# Patient Record
Sex: Male | Born: 2000 | Race: White | Hispanic: No | Marital: Single | Smoking: Never smoker
Health system: Southern US, Community
[De-identification: ages and names within clinical notes are randomized; demographics above are authoritative.]

---
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
## 2023-05-04 ENCOUNTER — Emergency Department (HOSPITAL_COMMUNITY)
Admission: EM | Admit: 2023-05-04 | Discharge: 2023-05-05 | Disposition: A | Discharge status: Home / Self Care | Payer: Self-pay | Attending: Emergency Medicine | Admitting: Emergency Medicine

## 2023-05-04 ENCOUNTER — Other Ambulatory Visit: Payer: Self-pay

## 2023-05-04 ENCOUNTER — Encounter (HOSPITAL_COMMUNITY): Payer: Self-pay | Admitting: Emergency Medicine

## 2023-05-04 DIAGNOSIS — R1084 Generalized abdominal pain: Secondary | ICD-10-CM | POA: Insufficient documentation

## 2023-05-04 DIAGNOSIS — R109 Unspecified abdominal pain: Secondary | ICD-10-CM

## 2023-05-04 LAB — URINALYSIS, ROUTINE W REFLEX MICROSCOPIC
Bilirubin Urine: NEGATIVE
Glucose, UA: NEGATIVE mg/dL
Hgb urine dipstick: NEGATIVE
Ketones, ur: NEGATIVE mg/dL
Leukocytes,Ua: NEGATIVE
Nitrite: NEGATIVE
Protein, ur: NEGATIVE mg/dL
Specific Gravity, Urine: 1.026 (ref 1.005–1.030)
pH: 5 (ref 5.0–8.0)

## 2023-05-04 LAB — COMPREHENSIVE METABOLIC PANEL
ALT: 20 U/L (ref 0–44)
AST: 19 U/L (ref 15–41)
Albumin: 4.5 g/dL (ref 3.5–5.0)
Alkaline Phosphatase: 92 U/L (ref 38–126)
Anion gap: 10 (ref 5–15)
BUN: 10 mg/dL (ref 6–20)
CO2: 25 mmol/L (ref 22–32)
Calcium: 9.5 mg/dL (ref 8.9–10.3)
Chloride: 102 mmol/L (ref 98–111)
Creatinine, Ser: 1 mg/dL (ref 0.61–1.24)
GFR, Estimated: >60 mL/min (ref >60)
Glucose, Bld: 124 mg/dL — ABNORMAL HIGH (ref 70–99)
Potassium: 3.8 mmol/L (ref 3.5–5.1)
Sodium: 137 mmol/L (ref 135–145)
Total Bilirubin: 0.8 mg/dL (ref 0.3–1.2)
Total Protein: 7.7 g/dL (ref 6.5–8.1)

## 2023-05-04 LAB — CBC
HCT: 45.7 % (ref 39.0–52.0)
Hemoglobin: 16.2 g/dL (ref 13.0–17.0)
MCH: 31.6 pg (ref 26.0–34.0)
MCHC: 35.4 g/dL (ref 30.0–36.0)
MCV: 89.1 fL (ref 80.0–100.0)
Platelets: 223 10*3/uL (ref 150–400)
RBC: 5.13 MIL/uL (ref 4.22–5.81)
RDW: 11.9 % (ref 11.5–15.5)
WBC: 7.2 10*3/uL (ref 4.0–10.5)
nRBC: 0 % (ref 0.0–0.2)

## 2023-05-04 LAB — LIPASE, BLOOD: Lipase: 28 U/L (ref 11–51)

## 2023-05-04 NOTE — ED Triage Notes (Signed)
Pt in with abdominal "gas-like" pain since this morning. States he saw what looked like a worm in his stool. Went to [facility] and they discharged him, told him to try OTC meds for relief. Pt states the pain is worse tonight and stool is now mucousy and "stringy". Lives with unvaccinated cats, thinks he may have a parasite

## 2023-05-05 LAB — GASTROINTESTINAL PANEL BY PCR, STOOL (REPLACES STOOL CULTURE)

## 2023-05-05 NOTE — Discharge Instructions (Signed)
Take tylenol or ibuprofen for pain. Follow up on your stool culture results. We advise follow up with gastroenterology. Call to schedule an appointment. Return for new or concerning symptoms.

## 2023-05-24 NOTE — ED Provider Notes (Signed)
[place] [facility] AT [facility] Provider Note   CSN: [id number] Arrival date & time: 05/04/23  2219     History  Chief Complaint  Patient presents with   Abdominal Pain    Robert Wilcox is a 22 y.o. male.  22 y/o male presents to the ED for diffuse, vague abdominal discomfort. He has not taken any medications for his symptoms. Currently denies pain. No hx of prior abdominal surgeries. No questionable food ingestion, travel, or drinking of questionable water sources. He does report some change to his stool. Reports it to be more "stringy". Also questions seeing a worm in his stool yesterday. No overt or ongoing melena, hematochezia. He is currently caring for stray kittens who have not yet been vaccinated. Was seen at OSH and told to try some OTC pinworm and parasite medication, but has yet to try this.  The history is provided by the patient. No language interpreter was used.  Abdominal Pain      Home Medications Prior to Admission medications   Medication Sig Start Date End Date Taking? Authorizing Provider  acetaminophen (TYLENOL) 325 MG tablet Take 2 tablets (650 mg total) by mouth every 6 (six) hours as needed. 11/12/18   Aviva Kluver B, PA-C  ibuprofen (ADVIL,MOTRIN) 400 MG tablet Take 1 tablet (400 mg total) by mouth every 6 (six) hours as needed. 11/12/18   Aviva Kluver B, PA-C      Allergies    Patient has no known allergies.    Review of Systems   Review of Systems  Gastrointestinal:  Positive for abdominal pain.  Ten systems reviewed and are negative for acute change, except as noted in the HPI.    Physical Exam Updated Vital Signs BP 107/87   Pulse 92   Temp 98.5 F (36.9 C) (Oral)   Resp 16   Wt 66.4 kg   SpO2 96%   Physical Exam Vitals and nursing note reviewed.  Constitutional:      General: He is not in acute distress.    Appearance: He is well-developed. He is not diaphoretic.     Comments: Nontoxic appearing and  in NAD  HENT:     Head: Normocephalic and atraumatic.  Eyes:     General: No scleral icterus.    Conjunctiva/sclera: Conjunctivae normal.  Cardiovascular:     Rate and Rhythm: Normal rate and regular rhythm.     Pulses: Normal pulses.  Pulmonary:     Effort: Pulmonary effort is normal. No respiratory distress.     Comments: Respirations even and unlabored Abdominal:     Palpations: Abdomen is soft. There is no mass.     Tenderness: There is no abdominal tenderness. There is no guarding.     Comments: Soft, nondistended, nontender abdomen.  Musculoskeletal:        General: Normal range of motion.     Cervical back: Normal range of motion.  Skin:    General: Skin is warm and dry.     Coloration: Skin is not pale.     Findings: No erythema or rash.  Neurological:     Mental Status: He is alert and oriented to person, place, and time.  Psychiatric:        Behavior: Behavior normal.     ED Results / Procedures / Treatments   Labs (all labs ordered are listed, but only abnormal results are displayed) Labs Reviewed  COMPREHENSIVE METABOLIC PANEL - Abnormal; Notable for the following components:  Result Value   Glucose, Bld 124 (*)    All other components within normal limits  GASTROINTESTINAL PANEL BY PCR, STOOL (REPLACES STOOL CULTURE)  LIPASE, BLOOD  CBC  URINALYSIS, ROUTINE W REFLEX MICROSCOPIC    EKG None  Radiology No results found.  Procedures Procedures    Medications Ordered in ED Medications - No data to display  ED Course/ Medical Decision Making/ A&P                             Medical Decision Making Amount and/or Complexity of Data Reviewed Labs: ordered.   This patient presents to the ED for concern of abdominal discomfort, this involves an extensive number of treatment options, and is a complaint that carries with it a high risk of complications and morbidity.  The differential diagnosis includes viral illness vs constipation vs GERD/PUD  vs biliary pathology vs IBD/IBS vs ruptured viscous   Co morbidities that complicate the patient evaluation  None known   Additional history obtained:  Additional history obtained from girlfriend, at bedside External records from outside source obtained and reviewed including prior imaging; no prior abdominal imaging noted.   Lab Tests:  I Ordered, and personally interpreted labs.  The pertinent results include:  Glucose 124. Otherwise normal CBC, CMP, lipase, UA.   Cardiac Monitoring:  The patient was maintained on a cardiac monitor.  I personally viewed and interpreted the cardiac monitored which showed an underlying rhythm of: NSR   Medicines ordered and prescription drug management:  I have reviewed the patients home medicines and have made adjustments as needed   Test Considered:  GI pathogen panel; ordered, pending   Problem List / ED Course:  Changes to stool with vague abdominal discomfort, no pain. Abdominal exam benign. Doubt emergent surgical or infectious etiology. Afebrile in the ED. No leukocytosis present on labs.No criteria for SIRS/sepsis. Liver and kidney function preserved. GI pathogen panel pending. Concern from patient regarding parasitosis. Unable to make definitive diagnosis in the ED today, but work up is reassuring. Plan for outpatient referral to GI.   Reevaluation:  After the interventions noted above, I reevaluated the patient and found that they have :stayed the same   Social Determinants of Health:  Good social support   Dispostion:  After consideration of the diagnostic results and the patients response to treatment, I feel that the patent would benefit from gastroenterology follow up. Referral given for outpatient evaluation. No concern for emergent or surgical etiology at this time. Patient to follow results of GI pathogen panel vis MyChart. Return precautions discussed and provided. Patient discharged in stable condition with no  unaddressed concerns.          Final Clinical Impression(s) / ED Diagnoses Final diagnoses:  Abdominal cramping    Rx / DC Orders ED Discharge Orders          Ordered    Ambulatory referral to Gastroenterology        05/05/23 0052              Antony Madura, PA-C 05/24/23 1226    Palumbo, April, MD 05/29/23 2302
